# Patient Record
Sex: Male | Born: 1985
Health system: Southern US, Community
[De-identification: ages and names within clinical notes are randomized; demographics above are authoritative.]

## PROBLEM LIST (undated history)

## (undated) DIAGNOSIS — R569 Unspecified convulsions: Secondary | ICD-10-CM

## (undated) HISTORY — PX: OTHER SURGICAL HISTORY: SHX169

## (undated) HISTORY — DX: Unspecified convulsions: R56.9

---

## 2005-11-08 ENCOUNTER — Emergency Department: Payer: Self-pay | Admitting: General Practice

## 2005-11-11 ENCOUNTER — Ambulatory Visit: Payer: Self-pay | Admitting: General Practice

## 2007-04-17 ENCOUNTER — Other Ambulatory Visit: Payer: Self-pay

## 2007-04-17 ENCOUNTER — Emergency Department: Payer: Self-pay | Admitting: Emergency Medicine

## 2007-04-22 ENCOUNTER — Ambulatory Visit: Payer: Self-pay | Admitting: Emergency Medicine

## 2011-04-11 ENCOUNTER — Emergency Department: Payer: Self-pay | Admitting: Emergency Medicine

## 2015-06-15 ENCOUNTER — Encounter (INDEPENDENT_AMBULATORY_CARE_PROVIDER_SITE_OTHER): Payer: Self-pay

## 2015-06-15 ENCOUNTER — Ambulatory Visit (INDEPENDENT_AMBULATORY_CARE_PROVIDER_SITE_OTHER): Payer: 59 | Admitting: Internal Medicine

## 2015-06-15 ENCOUNTER — Encounter: Payer: Self-pay | Admitting: Internal Medicine

## 2015-06-15 VITALS — BP 118/80 | HR 100 | Temp 98.1°F | Resp 18 | Ht 66.5 in | Wt 135.5 lb

## 2015-06-15 DIAGNOSIS — Z125 Encounter for screening for malignant neoplasm of prostate: Secondary | ICD-10-CM

## 2015-06-15 DIAGNOSIS — Z13220 Encounter for screening for lipoid disorders: Secondary | ICD-10-CM | POA: Diagnosis not present

## 2015-06-15 DIAGNOSIS — R21 Rash and other nonspecific skin eruption: Secondary | ICD-10-CM

## 2015-06-15 DIAGNOSIS — Z0000 Encounter for general adult medical examination without abnormal findings: Secondary | ICD-10-CM

## 2015-06-15 DIAGNOSIS — G40909 Epilepsy, unspecified, not intractable, without status epilepticus: Secondary | ICD-10-CM | POA: Diagnosis not present

## 2015-06-15 MED ORDER — TRIAMCINOLONE ACETONIDE 0.1 % EX CREA: 1.0000 "application " | TOPICAL_CREAM | Freq: Two times a day (BID) | CUTANEOUS | Status: DC
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
## 2015-06-17 ENCOUNTER — Encounter: Payer: Self-pay | Admitting: Internal Medicine

## 2015-06-17 DIAGNOSIS — Z0000 Encounter for general adult medical examination without abnormal findings: Secondary | ICD-10-CM | POA: Insufficient documentation

## 2015-06-17 DIAGNOSIS — G40909 Epilepsy, unspecified, not intractable, without status epilepticus: Secondary | ICD-10-CM | POA: Insufficient documentation

## 2015-06-17 NOTE — Assessment & Plan Note (Signed)
Rash as outlined lower extremities.  TCC cream as directed.  Follow.  No cellulitis.

## 2015-06-17 NOTE — Assessment & Plan Note (Signed)
Will schedule for a physical.  Desires to have psa checked.  Check routine labs including cholesterol.

## 2015-06-17 NOTE — Assessment & Plan Note (Signed)
Has been worked up by neurology.  On lamictal and doing well.  No seizures in the last 1 1/2 - 2 years.  Continue lamictal.  Follow.

## 2015-08-17 ENCOUNTER — Encounter: Payer: Self-pay | Admitting: Internal Medicine

## 2015-08-20 ENCOUNTER — Other Ambulatory Visit: Payer: Self-pay | Admitting: Internal Medicine

## 2015-08-20 ENCOUNTER — Other Ambulatory Visit (INDEPENDENT_AMBULATORY_CARE_PROVIDER_SITE_OTHER): Payer: 59

## 2015-08-20 DIAGNOSIS — G40909 Epilepsy, unspecified, not intractable, without status epilepticus: Secondary | ICD-10-CM

## 2015-08-20 DIAGNOSIS — Z125 Encounter for screening for malignant neoplasm of prostate: Secondary | ICD-10-CM

## 2015-08-20 DIAGNOSIS — Z13220 Encounter for screening for lipoid disorders: Secondary | ICD-10-CM

## 2015-08-20 DIAGNOSIS — R21 Rash and other nonspecific skin eruption: Secondary | ICD-10-CM

## 2015-08-20 LAB — CBC WITH DIFFERENTIAL/PLATELET
BASOS PCT: 0.7 % (ref 0.0–3.0)
Basophils Absolute: 0 10*3/uL (ref 0.0–0.1)
Eosinophils Absolute: 0.3 10*3/uL (ref 0.0–0.7)
Eosinophils Relative: 3.8 % (ref 0.0–5.0)
HEMATOCRIT: 46.2 % (ref 39.0–52.0)
Hemoglobin: 15.8 g/dL (ref 13.0–17.0)
LYMPHS ABS: 2.6 10*3/uL (ref 0.7–4.0)
LYMPHS PCT: 34.8 % (ref 12.0–46.0)
MCHC: 34.1 g/dL (ref 30.0–36.0)
MCV: 92 fl (ref 78.0–100.0)
Monocytes Absolute: 0.5 10*3/uL (ref 0.1–1.0)
Monocytes Relative: 6.9 % (ref 3.0–12.0)
NEUTROS ABS: 4 10*3/uL (ref 1.4–7.7)
NEUTROS PCT: 53.8 % (ref 43.0–77.0)
PLATELETS: 209 10*3/uL (ref 150.0–400.0)
RBC: 5.02 Mil/uL (ref 4.22–5.81)
RDW: 12.9 % (ref 11.5–15.5)
WBC: 7.4 10*3/uL (ref 4.0–10.5)

## 2015-08-20 LAB — HEPATIC FUNCTION PANEL
ALK PHOS: 71 U/L (ref 39–117)
ALT: 33 U/L (ref 0–53)
AST: 23 U/L (ref 0–37)
Albumin: 4.8 g/dL (ref 3.5–5.2)
BILIRUBIN DIRECT: 0.1 mg/dL (ref 0.0–0.3)
Total Bilirubin: 0.6 mg/dL (ref 0.2–1.2)
Total Protein: 7.5 g/dL (ref 6.0–8.3)

## 2015-08-20 LAB — LIPID PANEL
CHOL/HDL RATIO: 4
Cholesterol: 219 mg/dL — ABNORMAL HIGH (ref 0–200)
HDL: 58.3 mg/dL (ref >39.00)
LDL Cholesterol: 145 mg/dL — ABNORMAL HIGH (ref 0–99)
NONHDL: 160.32
TRIGLYCERIDES: 76 mg/dL (ref 0.0–149.0)
VLDL: 15.2 mg/dL (ref 0.0–40.0)

## 2015-08-20 LAB — BASIC METABOLIC PANEL
BUN: 16 mg/dL (ref 6–23)
CALCIUM: 10 mg/dL (ref 8.4–10.5)
CO2: 29 mEq/L (ref 19–32)
Chloride: 102 mEq/L (ref 96–112)
Creatinine, Ser: 0.93 mg/dL (ref 0.40–1.50)
GFR: 100.48 mL/min (ref >60.00)
Glucose, Bld: 96 mg/dL (ref 70–99)
Potassium: 4.9 mEq/L (ref 3.5–5.1)
SODIUM: 140 meq/L (ref 135–145)

## 2015-08-20 LAB — TSH: TSH: 2.03 u[IU]/mL (ref 0.35–4.50)

## 2015-08-20 LAB — PSA: PSA: 0.42 ng/mL (ref 0.10–4.00)

## 2015-08-20 NOTE — Progress Notes (Signed)
Order placed for lamictal level to be added.

## 2015-08-22 ENCOUNTER — Telehealth: Payer: Self-pay | Admitting: Surgical

## 2015-08-22 NOTE — Telephone Encounter (Signed)
error 

## 2015-08-24 ENCOUNTER — Other Ambulatory Visit (INDEPENDENT_AMBULATORY_CARE_PROVIDER_SITE_OTHER): Payer: 59

## 2015-08-24 ENCOUNTER — Other Ambulatory Visit: Payer: 59

## 2015-08-24 DIAGNOSIS — G40909 Epilepsy, unspecified, not intractable, without status epilepticus: Secondary | ICD-10-CM

## 2015-08-28 LAB — LAMOTRIGINE LEVEL: Lamotrigine Lvl: 4.4 ug/mL (ref 4.0–18.0)

## 2015-08-31 ENCOUNTER — Other Ambulatory Visit: Payer: 59

## 2016-01-22 ENCOUNTER — Other Ambulatory Visit: Payer: Self-pay | Admitting: Internal Medicine

## 2016-01-22 DIAGNOSIS — G40909 Epilepsy, unspecified, not intractable, without status epilepticus: Secondary | ICD-10-CM

## 2016-01-25 ENCOUNTER — Encounter: Payer: Self-pay | Admitting: Internal Medicine

## 2016-01-25 ENCOUNTER — Other Ambulatory Visit: Payer: 59

## 2016-01-25 ENCOUNTER — Ambulatory Visit (INDEPENDENT_AMBULATORY_CARE_PROVIDER_SITE_OTHER): Payer: 59 | Admitting: Internal Medicine

## 2016-01-25 VITALS — BP 126/72 | HR 76 | Temp 98.7°F | Resp 16 | Wt 132.0 lb

## 2016-01-25 DIAGNOSIS — R21 Rash and other nonspecific skin eruption: Secondary | ICD-10-CM

## 2016-01-25 DIAGNOSIS — G40909 Epilepsy, unspecified, not intractable, without status epilepticus: Secondary | ICD-10-CM

## 2016-01-25 LAB — HEPATIC FUNCTION PANEL
ALK PHOS: 77 U/L (ref 40–115)
ALT: 22 U/L (ref 9–46)
AST: 18 U/L (ref 10–40)
Albumin: 4.5 g/dL (ref 3.6–5.1)
BILIRUBIN DIRECT: 0.1 mg/dL (ref <=0.2)
BILIRUBIN TOTAL: 0.3 mg/dL (ref 0.2–1.2)
Indirect Bilirubin: 0.2 mg/dL (ref 0.2–1.2)
Total Protein: 7.2 g/dL (ref 6.1–8.1)

## 2016-01-25 LAB — CBC WITH DIFFERENTIAL/PLATELET
BASOS PCT: 1 %
Basophils Absolute: 87 cells/uL (ref 0–200)
EOS PCT: 2 %
Eosinophils Absolute: 174 cells/uL (ref 15–500)
HCT: 44 % (ref 38.5–50.0)
HEMOGLOBIN: 15.4 g/dL (ref 13.2–17.1)
LYMPHS ABS: 2697 {cells}/uL (ref 850–3900)
Lymphocytes Relative: 31 %
MCH: 31.7 pg (ref 27.0–33.0)
MCHC: 35 g/dL (ref 32.0–36.0)
MCV: 90.5 fL (ref 80.0–100.0)
MPV: 10.6 fL (ref 7.5–12.5)
Monocytes Absolute: 435 cells/uL (ref 200–950)
Monocytes Relative: 5 %
Neutro Abs: 5307 cells/uL (ref 1500–7800)
Neutrophils Relative %: 61 %
Platelets: 244 10*3/uL (ref 140–400)
RBC: 4.86 MIL/uL (ref 4.20–5.80)
RDW: 12.6 % (ref 11.0–15.0)
WBC: 8.7 10*3/uL (ref 3.8–10.8)

## 2016-01-25 LAB — BASIC METABOLIC PANEL
BUN: 7 mg/dL (ref 7–25)
CHLORIDE: 101 mmol/L (ref 98–110)
CO2: 27 mmol/L (ref 20–31)
CREATININE: 0.95 mg/dL (ref 0.60–1.35)
Calcium: 9.5 mg/dL (ref 8.6–10.3)
Glucose, Bld: 89 mg/dL (ref 65–99)
Potassium: 4.3 mmol/L (ref 3.5–5.3)
SODIUM: 139 mmol/L (ref 135–146)
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
## 2016-01-27 ENCOUNTER — Encounter: Payer: Self-pay | Admitting: Internal Medicine

## 2016-01-27 NOTE — Assessment & Plan Note (Deleted)
History of known seizures.  Has been worked up by neurology.  On lamictal.  Last level 4.4.  Stable from previous levels.  With seizure this week.  Has been sleep deprived and increased alcohol intake.  No seizures since the one episode earlier this week.  Is trying to get more sleep.  Has stopped drinking for now.  Check lamictal level.  Also check cbc, liver function and electrolytes.  Have him f/u with Dr Sherryll Burger.  Pt comfortable with this plan.

## 2016-01-27 NOTE — Assessment & Plan Note (Addendum)
History of known seizures.  Has been worked up by neurology.  On lamictal.  Last level 4.4.  Stable from previous levels.  With seizure this week.  Has been sleep deprived and increased alcohol intake.  No seizures since the one episode earlier this week.  Is trying to get more sleep.  Has stopped drinking for now.  Check lamictal level.  Also check cbc, liver function and electrolytes.  Have him f/u with Dr Sherryll Burger.  Pt comfortable with this plan.  Pt was instructed no driving for 6 months.

## 2016-01-28 ENCOUNTER — Other Ambulatory Visit: Payer: Self-pay | Admitting: Internal Medicine

## 2016-01-28 LAB — LAMOTRIGINE LEVEL: Lamotrigine Lvl: 4.4 ug/mL (ref 4.0–18.0)

## 2016-01-28 MED ORDER — LAMOTRIGINE 200 MG PO TABS: 200.0000 mg | ORAL_TABLET | Freq: Two times a day (BID) | ORAL | 2 refills | Status: DC

## 2016-02-29 ENCOUNTER — Other Ambulatory Visit: Payer: Self-pay | Admitting: Internal Medicine

## 2016-02-29 ENCOUNTER — Other Ambulatory Visit (INDEPENDENT_AMBULATORY_CARE_PROVIDER_SITE_OTHER): Payer: 59

## 2016-02-29 DIAGNOSIS — G40909 Epilepsy, unspecified, not intractable, without status epilepticus: Secondary | ICD-10-CM

## 2016-03-01 LAB — LAMOTRIGINE LEVEL: LAMOTRIGINE LVL: 5.6 ug/mL (ref 4.0–18.0)

## 2016-03-05 ENCOUNTER — Encounter: Payer: Self-pay | Admitting: *Deleted

## 2016-03-28 ENCOUNTER — Other Ambulatory Visit: Payer: Self-pay | Admitting: Internal Medicine

## 2016-06-27 ENCOUNTER — Other Ambulatory Visit: Payer: Self-pay | Admitting: Internal Medicine

## 2016-09-08 ENCOUNTER — Encounter: Payer: Self-pay | Admitting: Family Medicine

## 2016-09-08 ENCOUNTER — Ambulatory Visit (INDEPENDENT_AMBULATORY_CARE_PROVIDER_SITE_OTHER): Payer: 59 | Admitting: Family Medicine

## 2016-09-08 VITALS — BP 114/76 | HR 96 | Temp 98.3°F | Ht 67.0 in | Wt 129.2 lb

## 2016-09-08 DIAGNOSIS — S39012A Strain of muscle, fascia and tendon of lower back, initial encounter: Secondary | ICD-10-CM | POA: Diagnosis not present

## 2016-09-08 MED ORDER — HYDROCODONE-ACETAMINOPHEN 5-325 MG PO TABS: 1.0000 | ORAL_TABLET | Freq: Four times a day (QID) | ORAL | 0 refills | Status: DC | PRN

## 2016-09-08 NOTE — Patient Instructions (Addendum)
Low Back Strain Rehab  Ask your health care provider which exercises are safe for you. Do exercises exactly as told by your health care provider and adjust them as directed. It is normal to feel mild stretching, pulling, tightness, or discomfort as you do these exercises, but you should stop right away if you feel sudden pain or your pain gets worse. Do not begin these exercises until told by your health care provider.  Stretching and range of motion exercises  These exercises warm up your muscles and joints and improve the movement and flexibility of your back. These exercises also help to relieve pain, numbness, and tingling.  Exercise A: Single knee to chest     1. Lie on your back on a firm surface with both legs straight.  2. Bend one of your knees. Use your hands to move your knee up toward your chest until you feel a gentle stretch in your lower back and buttock.  ? Hold your leg in this position by holding onto the front of your knee.  ? Keep your other leg as straight as possible.  3. Hold for __________ seconds.  4. Slowly return to the starting position.  5. Repeat with your other leg.  Repeat __________ times. Complete this exercise __________ times a day.  Exercise B: Prone extension on elbows     1. Lie on your abdomen on a firm surface.  2. Prop yourself up on your elbows.  3. Use your arms to help lift your chest up until you feel a gentle stretch in your abdomen and your lower back.  ? This will place some of your body weight on your elbows. If this is uncomfortable, try stacking pillows under your chest.  ? Your hips should stay down, against the surface that you are lying on. Keep your hip and back muscles relaxed.  4. Hold for __________ seconds.  5. Slowly relax your upper body and return to the starting position.  Repeat __________ times. Complete this exercise __________ times a day.  Strengthening exercises  These exercises build strength and endurance in your back. Endurance is the ability  to use your muscles for a long time, even after they get tired.  Exercise C: Pelvic tilt   1. Lie on your back on a firm surface. Bend your knees and keep your feet flat.  2. Tense your abdominal muscles. Tip your pelvis up toward the ceiling and flatten your lower back into the floor.  ? To help with this exercise, you may place a small towel under your lower back and try to push your back into the towel.  3. Hold for __________ seconds.  4. Let your muscles relax completely before you repeat this exercise.  Repeat __________ times. Complete this exercise __________ times a day.  Exercise D: Alternating arm and leg raises     1. Get on your hands and knees on a firm surface. If you are on a hard floor, you may want to use padding to cushion your knees, such as an exercise mat.  2. Line up your arms and legs. Your hands should be below your shoulders, and your knees should be below your hips.  3. Lift your left leg behind you. At the same time, raise your right arm and straighten it in front of you.  ? Do not lift your leg higher than your hip.  ? Do not lift your arm higher than your shoulder.  ? Keep your abdominal and back   muscles tight.  ? Keep your hips facing the ground.  ? Do not arch your back.  ? Keep your balance carefully, and do not hold your breath.  4. Hold for __________ seconds.  5. Slowly return to the starting position and repeat with your right leg and your left arm.  Repeat __________ times. Complete this exercise __________times a day.  Exercise J: Single leg lower with bent knees   1. Lie on your back on a firm surface.  2. Tense your abdominal muscles and lift your feet off the floor, one foot at a time, so your knees and hips are bent in an "L" shape (at about 90 degrees).  ? Your knees should be over your hips and your lower legs should be parallel to the floor.  3. Keeping your abdominal muscles tense and your knee bent, slowly lower one of your legs so your toe touches the ground.  4. Lift  your leg back up to return to the starting position.  ? Do not hold your breath.  ? Do not let your back arch. Keep your back flat against the ground.  5. Repeat with your other leg.  Repeat __________ times. Complete this exercise __________ times a day.  Posture and body mechanics     Body mechanics refers to the movements and positions of your body while you do your daily activities. Posture is part of body mechanics. Good posture and healthy body mechanics can help to relieve stress in your body's tissues and joints. Good posture means that your spine is in its natural S-curve position (your spine is neutral), your shoulders are pulled back slightly, and your head is not tipped forward. The following are general guidelines for applying improved posture and body mechanics to your everyday activities.  Standing     · When standing, keep your spine neutral and your feet about hip-width apart. Keep a slight bend in your knees. Your ears, shoulders, and hips should line up.  · When you do a task in which you stand in one place for a long time, place one foot up on a stable object that is 2-4 inches (5-10 cm) high, such as a footstool. This helps keep your spine neutral.  Sitting     · When sitting, keep your spine neutral and keep your feet flat on the floor. Use a footrest, if necessary, and keep your thighs parallel to the floor. Avoid rounding your shoulders, and avoid tilting your head forward.  · When working at a desk or a computer, keep your desk at a height where your hands are slightly lower than your elbows. Slide your chair under your desk so you are close enough to maintain good posture.  · When working at a computer, place your monitor at a height where you are looking straight ahead and you do not have to tilt your head forward or downward to look at the screen.  Resting     · When lying down and resting, avoid positions that are most painful for you.  · If you have pain with activities such as sitting,  bending, stooping, or squatting (flexion-based activities), lie in a position in which your body does not bend very much. For example, avoid curling up on your side with your arms and knees near your chest (fetal position).  · If you have pain with activities such as standing for a long time or reaching with your arms (extension-based activities), lie with your spine in a   neutral position and bend your knees slightly. Try the following positions:  ? Lying on your side with a pillow between your knees.  ? Lying on your back with a pillow under your knees.  Lifting     · When lifting objects, keep your feet at least shoulder-width apart and tighten your abdominal muscles.  · Bend your knees and hips and keep your spine neutral. It is important to lift using the strength of your legs, not your back. Do not lock your knees straight out.  · Always ask for help to lift heavy or awkward objects.  This information is not intended to replace advice given to you by your health care provider. Make sure you discuss any questions you have with your health care provider.  Document Released: 06/16/2005 Document Revised: 02/21/2016 Document Reviewed: 03/28/2015  Elsevier Interactive Patient Education © 2017 [employer]
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
## 2017-05-25 ENCOUNTER — Other Ambulatory Visit: Payer: Self-pay | Admitting: Family Medicine

## 2017-05-25 DIAGNOSIS — R234 Changes in skin texture: Secondary | ICD-10-CM

## 2017-06-17 ENCOUNTER — Other Ambulatory Visit: Payer: Self-pay | Admitting: Family Medicine

## 2017-06-17 DIAGNOSIS — G40909 Epilepsy, unspecified, not intractable, without status epilepticus: Secondary | ICD-10-CM

## 2017-12-25 ENCOUNTER — Ambulatory Visit
Admission: RE | Admit: 2017-12-25 | Discharge: 2017-12-25 | Disposition: A | Discharge status: Home / Self Care | Payer: Commercial Managed Care - HMO | Source: Ambulatory Visit | Attending: Family Medicine | Admitting: Family Medicine

## 2017-12-25 ENCOUNTER — Other Ambulatory Visit: Payer: Self-pay | Admitting: Family Medicine

## 2017-12-25 ENCOUNTER — Telehealth: Payer: Self-pay | Admitting: Internal Medicine

## 2017-12-25 DIAGNOSIS — S0990XA Unspecified injury of head, initial encounter: Secondary | ICD-10-CM

## 2017-12-25 DIAGNOSIS — G40909 Epilepsy, unspecified, not intractable, without status epilepticus: Secondary | ICD-10-CM

## 2017-12-25 DIAGNOSIS — G44319 Acute post-traumatic headache, not intractable: Secondary | ICD-10-CM | POA: Diagnosis not present

## 2017-12-25 NOTE — Telephone Encounter (Signed)
See note

## 2017-12-25 NOTE — Telephone Encounter (Signed)
fYI  

## 2017-12-25 NOTE — Telephone Encounter (Signed)
Per Romeo AppleBen at [place] [facility]:  IMPRESSION: No acute intracranial abnormality.  Artifact involving the left temporal lobe as described.   Electronically Signed   By: Richarda OverlieAdam  Henn M.D.   On: 12/25/2017 12:26  Verbal readback done. [facility] Routed back to [facility] d/t test was ordered by Dr Earlene PlaterWallace

## 2018-01-08 ENCOUNTER — Encounter: Payer: Self-pay | Admitting: Internal Medicine

## 2018-01-08 ENCOUNTER — Ambulatory Visit (INDEPENDENT_AMBULATORY_CARE_PROVIDER_SITE_OTHER): Payer: 59 | Admitting: Internal Medicine

## 2018-01-08 VITALS — BP 98/62 | HR 74 | Wt 133.6 lb

## 2018-01-08 DIAGNOSIS — Z0000 Encounter for general adult medical examination without abnormal findings: Secondary | ICD-10-CM

## 2018-01-08 DIAGNOSIS — G40909 Epilepsy, unspecified, not intractable, without status epilepticus: Secondary | ICD-10-CM

## 2018-01-08 LAB — CBC WITH DIFFERENTIAL/PLATELET
BASOS PCT: 1.2 %
Basophils Absolute: 79 cells/uL (ref 0–200)
EOS PCT: 2.9 %
Eosinophils Absolute: 191 cells/uL (ref 15–500)
HEMATOCRIT: 40.2 % (ref 38.5–50.0)
HEMOGLOBIN: 13.6 g/dL (ref 13.2–17.1)
LYMPHS ABS: 2363 {cells}/uL (ref 850–3900)
MCH: 30.4 pg (ref 27.0–33.0)
MCHC: 33.8 g/dL (ref 32.0–36.0)
MCV: 89.7 fL (ref 80.0–100.0)
MPV: 11 fL (ref 7.5–12.5)
Monocytes Relative: 7.9 %
NEUTROS ABS: 3445 {cells}/uL (ref 1500–7800)
Neutrophils Relative %: 52.2 %
Platelets: 235 10*3/uL (ref 140–400)
RBC: 4.48 10*6/uL (ref 4.20–5.80)
RDW: 11.7 % (ref 11.0–15.0)
Total Lymphocyte: 35.8 %
WBC mixed population: 521 cells/uL (ref 200–950)
WBC: 6.6 10*3/uL (ref 3.8–10.8)

## 2018-01-08 LAB — HEPATIC FUNCTION PANEL
AG RATIO: 1.7 (calc) (ref 1.0–2.5)
ALBUMIN MSPROF: 4.5 g/dL (ref 3.6–5.1)
ALKALINE PHOSPHATASE (APISO): 72 U/L (ref 40–115)
ALT: 19 U/L (ref 9–46)
AST: 17 U/L (ref 10–40)
BILIRUBIN TOTAL: 0.4 mg/dL (ref 0.2–1.2)
Bilirubin, Direct: 0.1 mg/dL (ref 0.0–0.2)
Globulin: 2.6 g/dL (calc) (ref 1.9–3.7)
Indirect Bilirubin: 0.3 mg/dL (calc) (ref 0.2–1.2)
TOTAL PROTEIN: 7.1 g/dL (ref 6.1–8.1)

## 2018-01-08 LAB — TSH: TSH: 2.17 mIU/L (ref 0.40–4.50)

## 2018-01-08 LAB — BASIC METABOLIC PANEL
BUN: 7 mg/dL (ref 7–25)
CALCIUM: 9.7 mg/dL (ref 8.6–10.3)
CO2: 28 mmol/L (ref 20–32)
CREATININE: 0.92 mg/dL (ref 0.60–1.35)
Chloride: 99 mmol/L (ref 98–110)
Glucose, Bld: 72 mg/dL (ref 65–99)
POTASSIUM: 3.9 mmol/L (ref 3.5–5.3)
SODIUM: 140 mmol/L (ref 135–146)
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
## 2018-01-11 ENCOUNTER — Encounter: Payer: Self-pay | Admitting: Internal Medicine

## 2018-01-11 NOTE — Assessment & Plan Note (Signed)
Physical today 01/08/18.
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
## 2018-01-12 LAB — LAMOTRIGINE LEVEL: Lamotrigine Lvl: 6.4 ug/mL (ref 4.0–18.0)

## 2018-02-21 ENCOUNTER — Other Ambulatory Visit: Payer: Self-pay | Admitting: Family Medicine

## 2018-02-21 MED ORDER — TRIAMCINOLONE ACETONIDE 0.1 % MT PSTE: 1.0000 "application " | PASTE | Freq: Two times a day (BID) | OROMUCOSAL | 12 refills | Status: AC

## 2018-02-21 MED ORDER — VALACYCLOVIR HCL 1 G PO TABS: 1000.0000 mg | ORAL_TABLET | Freq: Two times a day (BID) | ORAL | 0 refills | Status: AC

## 2018-07-14 ENCOUNTER — Other Ambulatory Visit: Payer: Self-pay | Admitting: Neurology

## 2018-07-14 DIAGNOSIS — R569 Unspecified convulsions: Secondary | ICD-10-CM

## 2018-07-27 ENCOUNTER — Ambulatory Visit
Admission: RE | Admit: 2018-07-27 | Discharge: 2018-07-27 | Disposition: A | Discharge status: Home / Self Care | Payer: No Typology Code available for payment source | Source: Ambulatory Visit | Attending: Neurology | Admitting: Neurology

## 2018-07-27 DIAGNOSIS — R569 Unspecified convulsions: Secondary | ICD-10-CM | POA: Diagnosis not present

## 2018-07-27 MED ORDER — GADOBUTROL 1 MMOL/ML IV SOLN
6.0000 mL | Freq: Once | INTRAVENOUS | Status: AC | PRN
  Administered 2018-07-27: 6 mL via INTRAVENOUS

## 2019-04-11 ENCOUNTER — Other Ambulatory Visit: Payer: Self-pay

## 2019-04-11 DIAGNOSIS — Z20822 Contact with and (suspected) exposure to covid-19: Secondary | ICD-10-CM

## 2019-04-12 LAB — NOVEL CORONAVIRUS, NAA: SARS-CoV-2, NAA: NOT DETECTED
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
## 2020-08-30 ENCOUNTER — Other Ambulatory Visit (HOSPITAL_COMMUNITY): Payer: Self-pay | Admitting: Neurology

## 2020-10-17 ENCOUNTER — Other Ambulatory Visit: Payer: Self-pay

## 2020-10-17 MED FILL — Lacosamide Tab 100 MG: ORAL | 30 days supply | Qty: 60 | Fill #0 | Status: AC

## 2020-10-18 ENCOUNTER — Other Ambulatory Visit: Payer: Self-pay

## 2020-11-21 ENCOUNTER — Other Ambulatory Visit: Payer: Self-pay

## 2020-11-21 MED FILL — Lacosamide Tab 100 MG: ORAL | 30 days supply | Qty: 60 | Fill #1 | Status: AC

## 2020-11-22 ENCOUNTER — Other Ambulatory Visit: Payer: Self-pay

## 2021-01-03 ENCOUNTER — Other Ambulatory Visit: Payer: Self-pay

## 2021-01-03 MED FILL — Lacosamide Tab 100 MG: ORAL | 30 days supply | Qty: 60 | Fill #2 | Status: AC

## 2021-01-21 ENCOUNTER — Other Ambulatory Visit: Payer: Self-pay

## 2021-01-21 MED ORDER — LACOSAMIDE 100 MG PO TABS
ORAL_TABLET | ORAL | 5 refills | Status: DC
  Filled 2021-01-21 – 2021-02-08 (×2): qty 60, 30d supply, fill #0
  Filled 2021-03-18: qty 60, 30d supply, fill #1
  Filled 2021-09-19: qty 60, 30d supply, fill #2

## 2021-01-22 ENCOUNTER — Other Ambulatory Visit: Payer: Self-pay

## 2021-02-08 ENCOUNTER — Other Ambulatory Visit: Payer: Self-pay

## 2021-03-18 ENCOUNTER — Other Ambulatory Visit: Payer: Self-pay

## 2021-03-19 ENCOUNTER — Other Ambulatory Visit: Payer: Self-pay

## 2021-04-02 ENCOUNTER — Other Ambulatory Visit: Payer: Self-pay

## 2021-04-02 MED ORDER — LACOSAMIDE 150 MG PO TABS
ORAL_TABLET | ORAL | 5 refills | Status: AC
  Filled 2021-04-02: qty 60, 30d supply, fill #0
  Filled 2021-05-13: qty 60, 30d supply, fill #1
  Filled 2021-06-27: qty 60, 30d supply, fill #2
  Filled 2021-08-08: qty 60, 30d supply, fill #3

## 2021-04-03 ENCOUNTER — Other Ambulatory Visit: Payer: Self-pay

## 2021-05-13 ENCOUNTER — Other Ambulatory Visit: Payer: Self-pay

## 2021-05-14 ENCOUNTER — Other Ambulatory Visit: Payer: Self-pay

## 2021-06-27 ENCOUNTER — Other Ambulatory Visit: Payer: Self-pay

## 2021-06-28 ENCOUNTER — Other Ambulatory Visit: Payer: Self-pay

## 2021-08-08 ENCOUNTER — Other Ambulatory Visit: Payer: Self-pay

## 2021-08-09 ENCOUNTER — Other Ambulatory Visit: Payer: Self-pay

## 2021-08-27 ENCOUNTER — Other Ambulatory Visit: Payer: Self-pay

## 2021-08-27 MED ORDER — ZONISAMIDE 100 MG PO CAPS
ORAL_CAPSULE | ORAL | 5 refills | Status: DC
  Filled 2021-08-27: qty 90, 30d supply, fill #0
  Filled 2021-10-01: qty 90, 30d supply, fill #1
  Filled 2021-10-29: qty 90, 30d supply, fill #2
  Filled 2021-12-02: qty 90, 30d supply, fill #3
  Filled 2021-12-26: qty 90, 30d supply, fill #4
  Filled 2022-01-20: qty 90, 30d supply, fill #5

## 2021-08-27 MED ORDER — LAMOTRIGINE 100 MG PO TABS
ORAL_TABLET | ORAL | 2 refills | Status: AC
  Filled 2021-08-27: qty 30, 30d supply, fill #0
  Filled 2022-01-20: qty 21, 21d supply, fill #1
  Filled 2022-01-20: qty 9, 9d supply, fill #1
  Filled 2022-04-24: qty 30, 30d supply, fill #2

## 2021-08-27 MED ORDER — LAMOTRIGINE 200 MG PO TABS
ORAL_TABLET | ORAL | 5 refills | Status: DC
  Filled 2021-08-27: qty 30, 30d supply, fill #0
  Filled 2021-10-29: qty 30, 30d supply, fill #1
  Filled 2021-12-02: qty 30, 30d supply, fill #2
  Filled 2021-12-26: qty 30, 30d supply, fill #3
  Filled 2022-01-20: qty 30, 30d supply, fill #4
  Filled 2022-02-24: qty 30, 30d supply, fill #5

## 2021-08-27 MED ORDER — LACOSAMIDE 150 MG PO TABS
ORAL_TABLET | ORAL | 5 refills | Status: AC
  Filled 2021-10-09: qty 60, 30d supply, fill #0
  Filled 2021-11-15: qty 60, 30d supply, fill #1
  Filled 2021-12-26: qty 60, 30d supply, fill #2
  Filled 2022-01-20: qty 60, 30d supply, fill #3
  Filled 2022-03-28: qty 60, 30d supply, fill #4

## 2021-09-19 ENCOUNTER — Other Ambulatory Visit: Payer: Self-pay

## 2021-09-20 ENCOUNTER — Other Ambulatory Visit: Payer: Self-pay

## 2021-10-01 ENCOUNTER — Other Ambulatory Visit: Payer: Self-pay

## 2021-10-09 ENCOUNTER — Other Ambulatory Visit: Payer: Self-pay

## 2021-10-10 ENCOUNTER — Other Ambulatory Visit: Payer: Self-pay

## 2021-10-29 ENCOUNTER — Other Ambulatory Visit: Payer: Self-pay

## 2021-11-15 ENCOUNTER — Other Ambulatory Visit: Payer: Self-pay

## 2021-11-18 ENCOUNTER — Other Ambulatory Visit: Payer: Self-pay

## 2021-12-02 ENCOUNTER — Other Ambulatory Visit: Payer: Self-pay

## 2021-12-03 ENCOUNTER — Other Ambulatory Visit: Payer: Self-pay

## 2021-12-26 ENCOUNTER — Other Ambulatory Visit: Payer: Self-pay

## 2021-12-27 ENCOUNTER — Other Ambulatory Visit: Payer: Self-pay

## 2022-01-20 ENCOUNTER — Other Ambulatory Visit: Payer: Self-pay

## 2022-01-22 ENCOUNTER — Other Ambulatory Visit: Payer: Self-pay

## 2022-01-24 ENCOUNTER — Other Ambulatory Visit: Payer: Self-pay

## 2022-01-27 ENCOUNTER — Other Ambulatory Visit: Payer: Self-pay

## 2022-02-24 ENCOUNTER — Other Ambulatory Visit: Payer: Self-pay

## 2022-02-28 ENCOUNTER — Other Ambulatory Visit: Payer: Self-pay

## 2022-02-28 MED ORDER — ZONISAMIDE 100 MG PO CAPS
ORAL_CAPSULE | ORAL | 5 refills | Status: AC
  Filled 2022-02-28: qty 90, 30d supply, fill #0
  Filled 2022-03-28: qty 90, 30d supply, fill #1
  Filled 2022-04-24: qty 90, 30d supply, fill #2

## 2022-03-25 ENCOUNTER — Other Ambulatory Visit: Payer: Self-pay

## 2022-03-25 MED ORDER — LAMOTRIGINE 200 MG PO TABS
ORAL_TABLET | ORAL | 5 refills | Status: AC
  Filled 2022-03-25: qty 30, 30d supply, fill #0
  Filled 2022-04-24: qty 30, 30d supply, fill #1

## 2022-03-28 ENCOUNTER — Other Ambulatory Visit: Payer: Self-pay

## 2022-03-31 ENCOUNTER — Other Ambulatory Visit: Payer: Self-pay

## 2022-04-24 ENCOUNTER — Other Ambulatory Visit: Payer: Self-pay

## 2022-11-25 ENCOUNTER — Other Ambulatory Visit: Payer: Self-pay

## 2023-08-11 ENCOUNTER — Other Ambulatory Visit (HOSPITAL_COMMUNITY): Payer: Self-pay
